# Patient Record
Sex: Female | Born: 1990 | Race: White | Hispanic: No | Marital: Single | Smoking: Never smoker
Health system: Southern US, Community
[De-identification: ages and names within clinical notes are randomized; demographics above are authoritative.]

## PROBLEM LIST (undated history)

## (undated) DIAGNOSIS — F909 Attention-deficit hyperactivity disorder, unspecified type: Secondary | ICD-10-CM

## (undated) DIAGNOSIS — S83519A Sprain of anterior cruciate ligament of unspecified knee, initial encounter: Secondary | ICD-10-CM

## (undated) DIAGNOSIS — Z87898 Personal history of other specified conditions: Secondary | ICD-10-CM

## (undated) HISTORY — DX: Attention-deficit hyperactivity disorder, unspecified type: F90.9

## (undated) HISTORY — DX: Personal history of other specified conditions: Z87.898

## (undated) HISTORY — PX: ANTERIOR CRUCIATE LIGAMENT REPAIR: SHX115

## (undated) HISTORY — PX: ORIF DISTAL RADIUS FRACTURE: SUR927

## (undated) HISTORY — DX: Sprain of anterior cruciate ligament of unspecified knee, initial encounter: S83.519A

---
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
## 2011-01-15 ENCOUNTER — Encounter: Payer: Self-pay | Admitting: Orthopedic Surgery

## 2011-01-23 NOTE — Letter (Signed)
Summary: Appointment - Reminder 2  [facility] [place] [address] [place] [address]   Phone: [phone]  Fax: [phone]     September 27, 2010 MRN: [id number]   [facility] [address] [place], [place]  [address]   Dear Ms. Verdell Carmine,  Our records indicate that it is time to schedule a follow-up appointment.  Dr.  Dietrich Pates           recommended that you follow up with [place] in        05/2010    . It is very important that we reach you to schedule this appointment. We look forward to participating in your health care needs. Please contact us at the number listed above at your earliest convenience to schedule your appointment.  If you are unable to make an appointment at this time, give [place] a call so we can update our records.     Sincerely,   Glass blower/designer

## 2011-04-01 LAB — BASIC METABOLIC PANEL
CO2: 30 mEq/L (ref 19–32)
Chloride: 110 mEq/L (ref 96–112)
Potassium: 3.5 mEq/L (ref 3.5–5.1)
Sodium: 142 mEq/L (ref 135–145)

## 2011-04-01 LAB — CBC
Hemoglobin: 14.3 g/dL (ref 12.0–16.0)
MCHC: 35.6 g/dL (ref 31.0–37.0)
MCV: 93.6 fL (ref 78.0–98.0)
RBC: 4.28 MIL/uL (ref 3.80–5.70)

## 2011-04-01 LAB — DIFFERENTIAL
Basophils Relative: 0 % (ref 0–1)
Eosinophils Absolute: 0.2 10*3/uL (ref 0.0–1.2)
Monocytes Absolute: 0.5 10*3/uL (ref 0.2–1.2)
Monocytes Relative: 6 % (ref 3–11)
Neutro Abs: 6.4 10*3/uL (ref 1.7–8.0)

## 2011-04-01 LAB — PREGNANCY, URINE: Preg Test, Ur: NEGATIVE

## 2011-04-01 LAB — URINALYSIS, ROUTINE W REFLEX MICROSCOPIC
Glucose, UA: NEGATIVE mg/dL
Hgb urine dipstick: NEGATIVE
Ketones, ur: NEGATIVE mg/dL
Protein, ur: NEGATIVE mg/dL

## 2011-04-01 LAB — URINE CULTURE: Colony Count: NO GROWTH

## 2011-05-08 NOTE — H&P (Signed)
NAMEBENELLI, Kristin Mathis              ACCOUNT NO.:  [id number]   MEDICAL RECORD NO.:  [id number]          PATIENT TYPE:  EMS   LOCATION:  ED                            FACILITY:  [facility]   PHYSICIAN:  Kingsley Callander. Ouida Sills, MD       DATE OF BIRTH:  May 18, 1991   DATE OF ADMISSION:  08/05/2009  DATE OF DISCHARGE:  08/13/2010LH                              HISTORY & PHYSICAL   CHIEF COMPLAINT:  Passed out.   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  She was evaluated initially by the rescue  squad.  The rescue squad remembers report that she was alert, but  anxious initially and began hyperventilating and then passed out again.  At the time of my arrival in the emergency room, she was alert and  appeared in her normal state.  She had a similar episode last month and  was evaluated in the emergency room.  She has a history of recurrent  syncopal episodes.  I had actually witnessed one last spring at a soccer  game in [place].  She had experienced a syncopal episode while  playing soccer on a very night.  She was evaluated on the field at that  point by paramedics, but was not then transported to a medical facility.  She has previously had a normal EKG.  She has never had an  echocardiogram.  It sounds as though her medical care has been through a  local family practice group, this is my first involvement in her case  other than when she was checked on the field at the soccer game.  She  has never had any witnessed-seizure activity.  She did not experience  tongue biting or any urinary or fecal incontinence.  She has had 4  episodes of syncope over the past year.  She previously had another  episode 3 years ago while playing soccer.  She takes no medications.  She had been treated with a course of Keflex for UTI last month.  She  has otherwise been healthy except  for an old elbow injury.   PHYSICAL EXAMINATION:  VITAL SIGNS:  Normal.  HEART:  Normal rhythm on telemetry.  HEENT:  Her conjunctivae are mildly injected with appearance she had  been tearful.  NECK:  Supple with no thyromegaly or lymphadenopathy.  LUNGS:  Clear.  HEART:  Regular with no murmurs, gallops, or rubs.  ABDOMEN:  Nontender.  No hepatosplenomegaly.  EXTREMITIES:  No calf tenderness or swelling.  No edema.  Pulses intact.  NEUROLOGIC:  Fully intact.   Her EKG reveals normal sinus rhythm with a normal QT interval.   IMPRESSION:  Recurrent syncopal episodes.  She is now hemodynamically  stable and neurologically intact.  She will be evaluated further as an  outpatient with an echocardiogram and a Cardiology consultation.      Kingsley Callander. Ouida Sills, MD  Electronically Signed     ROF/MEDQ  D:  08/06/2009  T:  08/06/2009  Job:  [id number]

## 2011-05-08 NOTE — Op Note (Signed)
NAMEGUSTAVO, Kristin Mathis             ACCOUNT NO.:  [id number]   MEDICAL RECORD NO.:  [id number]          PATIENT TYPE:  AMB   LOCATION:  DSC                          FACILITY:  [facility]   PHYSICIAN:  Harvie Junior, M.D.   DATE OF BIRTH:  1991/01/02   DATE OF PROCEDURE:  10/08/2008  DATE OF DISCHARGE:                               OPERATIVE REPORT   PREOPERATIVE DIAGNOSIS:  Anterior cruciate ligament tear.   POSTOPERATIVE DIAGNOSES:  1. Anterior cruciate ligament tear.  2. Medial plica.   OPERATIVE PROCEDURE:  1. Anterior cruciate ligament reconstruction  with essential one-third      patellar tendon allograft.  2. Debridement of medial plica.   SURGEON:  Harvie Junior, M.D.   ASSISTANT:  Marshia Ly, P.A.   ANESTHESIA:  General.   BRIEF HISTORY:  Ms. Bufford Spikes is a 20 year old female with a history of  having had a ACL and MCL tear.  We saw her in the office and evaluated,  ultimately got MRI which confirmed the diagnosis, allowed her a month to  allow the MCL to heal up and once her MCL felt good we took her to  operating room for ACL reconstruction.   PROCEDURE:  The patient was taken to the operating room.  After adequate  anesthesia obtained with general anesthetic the patient was placed  supine on the operating table.  The left leg was then examined under  anesthesia and noted to be pivot positive, Lachman positive.  She was  then prepped and draped in sterile fashion.  Following this routine  arthroscopic examination revealed there was a pristine patellofemoral  joint.  Attention was turned towards the medial compartment.  There was  a large medial plica block in the anterior set of the medial compartment  which was debrided to allow entrance into the medial compartment.  Medial compartment showed a normal medial meniscus.  We probed the  medial femoral condyle which was normal.  Attention was turned to the  notch where there was an absent ACL.  Attention was turned to  the  lateral side which was normal.  There was a large cyclops lesion  blocking the lateral compartment which was debrided as well as  anteriorly.  Once this was completely debrided, the attention was turned  to the central portion of the knee where a notchplasty was performed  after debridement of the old stump of the ACL.  Once this was completed,  attention was turned towards the tibial guide where the tibial guide was  set at 55 degrees, and this was placed on the old stump of the ACL 7 mm  anterior to the posterior cruciate ligament.  A small incision was made  in the proximal medial tibia.  The guide was then used and a guidewire  was advanced into the proximal tibia to stump the ACL 10 mm.  Reamer was  then used to overream this and a rasp was used in the back of the  tunnel.  A 6.5 mm over the top guide was then used and a Beath needle  was advanced after distal lateral femur.  This was reamed to a level of  25 that matched to the femoral bone plug.  At this point, the notch was  placed to the anterior portion of this tunnel.  At this point, all bony  shavings and remaining tissue was removed from the knee.  At this point,  the graft was advanced into the knee and excellent fixation was achieved  of the graft.  The femoral side with a 7 x 25 mm screw with a sheath and  on the tibial side with a 7 x 20 screw.  Excellent fixation of the graft  was achieved, perfect tensioning and extension and the knee came easily  under full extension and the graft was probed and had perfect tension.  Knee was now Lachman negative, pivot negative.  The knee was copiously  and thoroughly irrigated and suctioned dried.  The small incision on the  proximal medial tibia was closed with subcuticular stitches and the  portals were closed with a bandage.  Sterile compressive dressing was  applied as well as a TED stocking.  The patient was taken to the  recovery room and was noted to be in satisfactory  condition.  Estimated  blood loss for procedure was none.      Harvie Junior, M.D.  Electronically Signed     JLG/MEDQ  D:  10/08/2008  T:  10/08/2008  Job:  [id number]

## 2011-09-25 LAB — POCT HEMOGLOBIN-HEMACUE: Hemoglobin: 15

## 2011-12-19 ENCOUNTER — Encounter: Payer: Self-pay | Admitting: Cardiology

## 2012-01-21 ENCOUNTER — Encounter (HOSPITAL_COMMUNITY): Payer: Self-pay

## 2012-01-21 ENCOUNTER — Emergency Department (HOSPITAL_COMMUNITY)
Admission: EM | Admit: 2012-01-21 | Discharge: 2012-01-21 | Disposition: A | Discharge status: Home / Self Care | Payer: 59 | Attending: Emergency Medicine | Admitting: Emergency Medicine

## 2012-01-21 ENCOUNTER — Emergency Department (HOSPITAL_COMMUNITY): Payer: 59

## 2012-01-21 DIAGNOSIS — R0602 Shortness of breath: Secondary | ICD-10-CM | POA: Insufficient documentation

## 2012-01-21 DIAGNOSIS — N898 Other specified noninflammatory disorders of vagina: Secondary | ICD-10-CM | POA: Insufficient documentation

## 2012-01-21 DIAGNOSIS — R071 Chest pain on breathing: Secondary | ICD-10-CM | POA: Insufficient documentation

## 2012-01-21 DIAGNOSIS — R0789 Other chest pain: Secondary | ICD-10-CM

## 2012-01-21 LAB — URINALYSIS, ROUTINE W REFLEX MICROSCOPIC
Ketones, ur: NEGATIVE mg/dL
Leukocytes, UA: NEGATIVE
Nitrite: NEGATIVE
Urobilinogen, UA: 0.2 mg/dL (ref 0.0–1.0)
pH: 5.5 (ref 5.0–8.0)

## 2012-01-21 NOTE — ED Notes (Signed)
Pt reports reached for something with left arm and muscle in left side "caught."  Reports hurts to take a deep breath.  Denies injury, says has happened several times this past month.

## 2012-01-21 NOTE — ED Provider Notes (Signed)
History     CSN: [id number]  Arrival date & time 01/21/12  1135   First MD Initiated Contact with Patient 01/21/12 1329      Chief Complaint  Patient presents with  . Back Pain    (Consider location/radiation/quality/duration/timing/severity/associated sxs/prior treatment) HPI Comments: Pt has had intermittent pain in her L lower lateral rib area for ~ 1 year.  It has become acutely worse in the past couple days.  No recent or remote trauma.  No fever or cough.    "sharp" pain with deep inspiration, movement and palpation.  No OCP meds.  No prolonged convalescence.  No Ca history.  No prior DVT's in pt or extended family.  No hemoptysis.  The history is provided by the patient and a parent. No language interpreter was used.    Past Medical History  Diagnosis Date  . History of syncope   . Anterior cruciate ligament tear   . ADHD (attention deficit hyperactivity disorder)     Past Surgical History  Procedure Date  . Orif distal radius fracture   . Anterior cruciate ligament repair     No family history on file.  History  Substance Use Topics  . Smoking status: Never Smoker   . Smokeless tobacco: Not on file  . Alcohol Use: No    OB History    Grav Para Term Preterm Abortions TAB SAB Ect Mult Living                  Review of Systems  Respiratory: Positive for shortness of breath. Negative for cough, wheezing and stridor.   Cardiovascular: Negative for chest pain, palpitations and leg swelling.  Genitourinary: Positive for vaginal bleeding. Negative for dysuria, frequency, hematuria, flank pain, vaginal discharge and vaginal pain.  All other systems reviewed and are negative.    Allergies  Review of patient's allergies indicates no known allergies.  Home Medications  No current outpatient prescriptions on file.  BP 129/75  Pulse 99  Temp(Src) 98.5 F (36.9 C) (Oral)  Resp 18  Ht 5\' 4"  (1.626 m)  Wt 135 lb (61.236 kg)  BMI 23.17 kg/m2  SpO2 99%  LMP  01/16/2012  Physical Exam  Nursing note and vitals reviewed. Constitutional: She is oriented to person, place, and time. She appears well-developed and well-nourished. No distress.  HENT:  Head: Normocephalic and atraumatic.  Eyes: EOM are normal.  Neck: Normal range of motion.  Cardiovascular: Normal rate, regular rhythm and normal heart sounds.   Pulmonary/Chest: Effort normal and breath sounds normal. She has no decreased breath sounds. She has no wheezes. She has no rhonchi. She has no rales.    Abdominal: Soft. She exhibits no distension. There is no tenderness.  Musculoskeletal: Normal range of motion.  Neurological: She is alert and oriented to person, place, and time.  Skin: Skin is warm and dry.  Psychiatric: She has a normal mood and affect. Judgment normal.    ED Course  Procedures (including critical care time)   Labs Reviewed  URINALYSIS, ROUTINE W REFLEX MICROSCOPIC  PREGNANCY, URINE  D-DIMER, QUANTITATIVE   No results found.   No diagnosis found.    MDM          Worthy Rancher, PA 01/22/12 [phone]

## 2012-01-21 NOTE — ED Provider Notes (Signed)
21 year old female has been having episodes of sharp, left-sided chest pain for the last year. Pain will last up to 10-15 minutes before resolving. During that time, she can't take a deep breath because it makes the pain worse and she feels like she can't catch her breath. These episodes will come as often as every 2-3 days. She has not taken anything to try and help. In between episodes, she has no difficulties whatsoever. Workup is negative including negative chest x-ray. I doubt that this represents any serious pathology. She will be given a trial of NSAIDs to see if she gets relief.  Dione Booze, MD 01/21/12 [phone]

## 2012-01-21 NOTE — ED Notes (Signed)
Pain lt lat chest , onset this am when reached for something. Has not taken anything for pain. Pain has decreased.

## 2012-01-21 NOTE — ED Notes (Signed)
Pain lt post thigh for 3-4 mos.  Seen here for same.  Awaiting appt with Dr Romeo Apple.  Pain is from post thigh to lower leg.  No known injury. Has chronic back pain.

## 2012-01-22 NOTE — ED Provider Notes (Signed)
Medical screening examination/treatment/procedure(s) were performed by non-physician practitioner and as supervising physician I was immediately available for consultation/collaboration.   Dione Booze, MD 01/22/12 1730

## 2012-11-23 ENCOUNTER — Encounter (HOSPITAL_COMMUNITY): Payer: Self-pay | Admitting: Emergency Medicine

## 2012-11-23 ENCOUNTER — Emergency Department (HOSPITAL_COMMUNITY): Payer: 59

## 2012-11-23 ENCOUNTER — Emergency Department (HOSPITAL_COMMUNITY)
Admission: EM | Admit: 2012-11-23 | Discharge: 2012-11-23 | Disposition: A | Discharge status: Home / Self Care | Payer: 59 | Attending: Emergency Medicine | Admitting: Emergency Medicine

## 2012-11-23 DIAGNOSIS — R509 Fever, unspecified: Secondary | ICD-10-CM | POA: Insufficient documentation

## 2012-11-23 DIAGNOSIS — Z8781 Personal history of (healed) traumatic fracture: Secondary | ICD-10-CM | POA: Insufficient documentation

## 2012-11-23 DIAGNOSIS — R1110 Vomiting, unspecified: Secondary | ICD-10-CM | POA: Insufficient documentation

## 2012-11-23 DIAGNOSIS — R05 Cough: Secondary | ICD-10-CM | POA: Insufficient documentation

## 2012-11-23 DIAGNOSIS — F909 Attention-deficit hyperactivity disorder, unspecified type: Secondary | ICD-10-CM | POA: Insufficient documentation

## 2012-11-23 DIAGNOSIS — J988 Other specified respiratory disorders: Secondary | ICD-10-CM

## 2012-11-23 DIAGNOSIS — J029 Acute pharyngitis, unspecified: Secondary | ICD-10-CM

## 2012-11-23 DIAGNOSIS — Z79899 Other long term (current) drug therapy: Secondary | ICD-10-CM | POA: Insufficient documentation

## 2012-11-23 DIAGNOSIS — J3489 Other specified disorders of nose and nasal sinuses: Secondary | ICD-10-CM | POA: Insufficient documentation

## 2012-11-23 DIAGNOSIS — R51 Headache: Secondary | ICD-10-CM | POA: Insufficient documentation

## 2012-11-23 DIAGNOSIS — J069 Acute upper respiratory infection, unspecified: Secondary | ICD-10-CM | POA: Insufficient documentation

## 2012-11-23 DIAGNOSIS — R059 Cough, unspecified: Secondary | ICD-10-CM

## 2012-11-23 DIAGNOSIS — R52 Pain, unspecified: Secondary | ICD-10-CM | POA: Insufficient documentation

## 2012-11-23 LAB — RAPID STREP SCREEN (MED CTR MEBANE ONLY): Streptococcus, Group A Screen (Direct): NEGATIVE

## 2012-11-23 MED ORDER — SODIUM CHLORIDE 0.9 % IV BOLUS (SEPSIS)
1000.0000 mL | Freq: Once | INTRAVENOUS | Status: AC
  Administered 2012-11-23: 1000 mL via INTRAVENOUS

## 2012-11-23 MED ORDER — DEXAMETHASONE 10 MG/ML FOR PEDIATRIC ORAL USE: 10.0000 mg | Freq: Once | INTRAMUSCULAR | Status: DC

## 2012-11-23 MED ORDER — DEXAMETHASONE SODIUM PHOSPHATE 4 MG/ML IJ SOLN
10.0000 mg | Freq: Once | INTRAMUSCULAR | Status: AC
  Administered 2012-11-23: 10 mg via INTRAVENOUS
  Filled 2012-11-23: qty 3

## 2012-11-23 MED ORDER — ALBUTEROL SULFATE HFA 108 (90 BASE) MCG/ACT IN AERS: 2.0000 | INHALATION_SPRAY | RESPIRATORY_TRACT | Status: AC | PRN

## 2012-11-23 MED ORDER — ALBUTEROL SULFATE (5 MG/ML) 0.5% IN NEBU
2.5000 mg | INHALATION_SOLUTION | Freq: Once | RESPIRATORY_TRACT | Status: AC
  Administered 2012-11-23: 2.5 mg via RESPIRATORY_TRACT
  Filled 2012-11-23: qty 0.5

## 2012-11-23 NOTE — ED Provider Notes (Signed)
History     CSN: [id number]  Arrival date & time 11/23/12  1036   First MD Initiated Contact with Patient 11/23/12 1158      Chief Complaint  Patient presents with  . Vomiting  . Headache  . Sore Throat  . Generalized Body Aches  . Fever    (Consider location/radiation/quality/duration/timing/severity/associated sxs/prior treatment) Patient is a 21 y.o. female presenting with headaches and pharyngitis. The history is provided by the patient.  Headache   Sore Throat  She had onset yesterday which he describes as flulike symptoms. She did not have any objective fever, but has had chills and sweats and bodyaches. There's been a sore throat and nasal congestion. There's been a cough productive of some clear mucus. She has had post tussive emesis but no nausea. She has had generalized myalgias. There's been no constipation or diarrhea. She denies any sick contacts. In spite of post tussive emesis, she has been able to hold significant amounts of fluids down. She tried taking NyQuil which gave her temporary relief. Has not had her flu shot this year. Symptoms are described as severe. Swallowing makes her throat worse. NyQuil does give temporary relief.  Past Medical History  Diagnosis Date  . History of syncope   . Anterior cruciate ligament tear   . ADHD (attention deficit hyperactivity disorder)     Past Surgical History  Procedure Date  . Orif distal radius fracture   . Anterior cruciate ligament repair     History reviewed. No pertinent family history.  History  Substance Use Topics  . Smoking status: Never Smoker   . Smokeless tobacco: Not on file  . Alcohol Use: No    OB History    Grav Para Term Preterm Abortions TAB SAB Ect Mult Living                  Review of Systems  All other systems reviewed and are negative.    Allergies  Review of patient's allergies indicates no known allergies.  Home Medications   Current Outpatient Rx  Name  Route  Sig   Dispense  Refill  . IBUPROFEN 200 MG PO TABS   Oral   Take 400 mg by mouth every 6 (six) hours as needed. For pain         . NORETHIN ACE-ETH ESTRAD-FE 1-20 MG-MCG PO TABS   Oral   Take 1 tablet by mouth daily.         . NYQUIL PO   Oral   Take 30 mLs by mouth daily as needed. For cough/cold           BP 128/79  Pulse 106  Temp 98.3 F (36.8 C) (Oral)  Resp 18  Ht 5\' 4"  (1.626 m)  Wt 132 lb (59.875 kg)  BMI 22.66 kg/m2  SpO2 98%  LMP 11/17/2012  Physical Exam  Nursing note and vitals reviewed. 21 year old female, resting comfortably and in no acute distress. Vital signs are significant for tachycardia with heart rate 106. Oxygen saturation is 98%, which is normal. Head is normocephalic and atraumatic. PERRLA, EOMI. Oropharynx shows mild erythema and mild swelling. She is tolerating her secretions well. Phonation is normal. Neck is nontender and supple without adenopathy or JVD. Back is nontender and there is no CVA tenderness. Lungs are clear without rales, wheezes, or rhonchi. Chest is nontender. Heart has regular rate and rhythm without murmur. Abdomen is soft, flat, nontender without masses or hepatosplenomegaly and peristalsis is normoactive.  Extremities have no cyanosis or edema, full range of motion is present. Skin is warm and dry without rash. Neurologic: Mental status is normal, cranial nerves are intact, there are no motor or sensory deficits.   ED Course  Procedures (including critical care time)  Results for orders placed during the hospital encounter of 11/23/12  RAPID STREP SCREEN      Component Value Range   Streptococcus, Group A Screen (Direct) NEGATIVE  NEGATIVE   Dg Chest 2 View  11/23/2012  *RADIOLOGY REPORT*  Clinical Data: Cough and fever.  Vomiting and headache  CHEST - 2 VIEW  Comparison:  01/21/2012  Findings:  The heart size and mediastinal contours are within normal limits.  Both lungs are clear.  The visualized skeletal structures  are unremarkable.  IMPRESSION: No active cardiopulmonary disease.   Original Report Authenticated By: Janeece Riggers, M.D.    Images viewed by me.  1. Pharyngitis   2. Respiratory tract infection   3. Cough       MDM  Acute respiratory illness with predominant pharyngitis and bronchitis. A chest x-ray will be obtained to rule out pneumonia and strep screen will be obtained to rule out streptococcal pharyngitis. There is no influenza in the community currently, so she will not be treated with antivirals for influenza. Because of tachycardia, she will be given IV hydration and he'll be given a dose of dexamethasone for her pharyngitis. She'll be given a therapeutic trial of albuterol to see if it helps her cough.  She feels much better after the above noted medication. She is sent home with a prescription for albuterol inhaler and is given instruction sheets for sore throat, cough, upper respiratory infection.    Dione Booze, MD 11/23/12 1357

## 2012-11-23 NOTE — ED Notes (Signed)
Pt c/o sore throat, cough, vomiting, and fever since yesterday.

## 2014-11-07 IMAGING — CR DG CHEST 2V
2 series · 2 of 2 positions shown · non-contrast
Comparison: 01/21/2012

CLINICAL DATA: Cough and fever.  Vomiting and headache

CHEST - 2 VIEW

[view not recorded (1 of 2)]
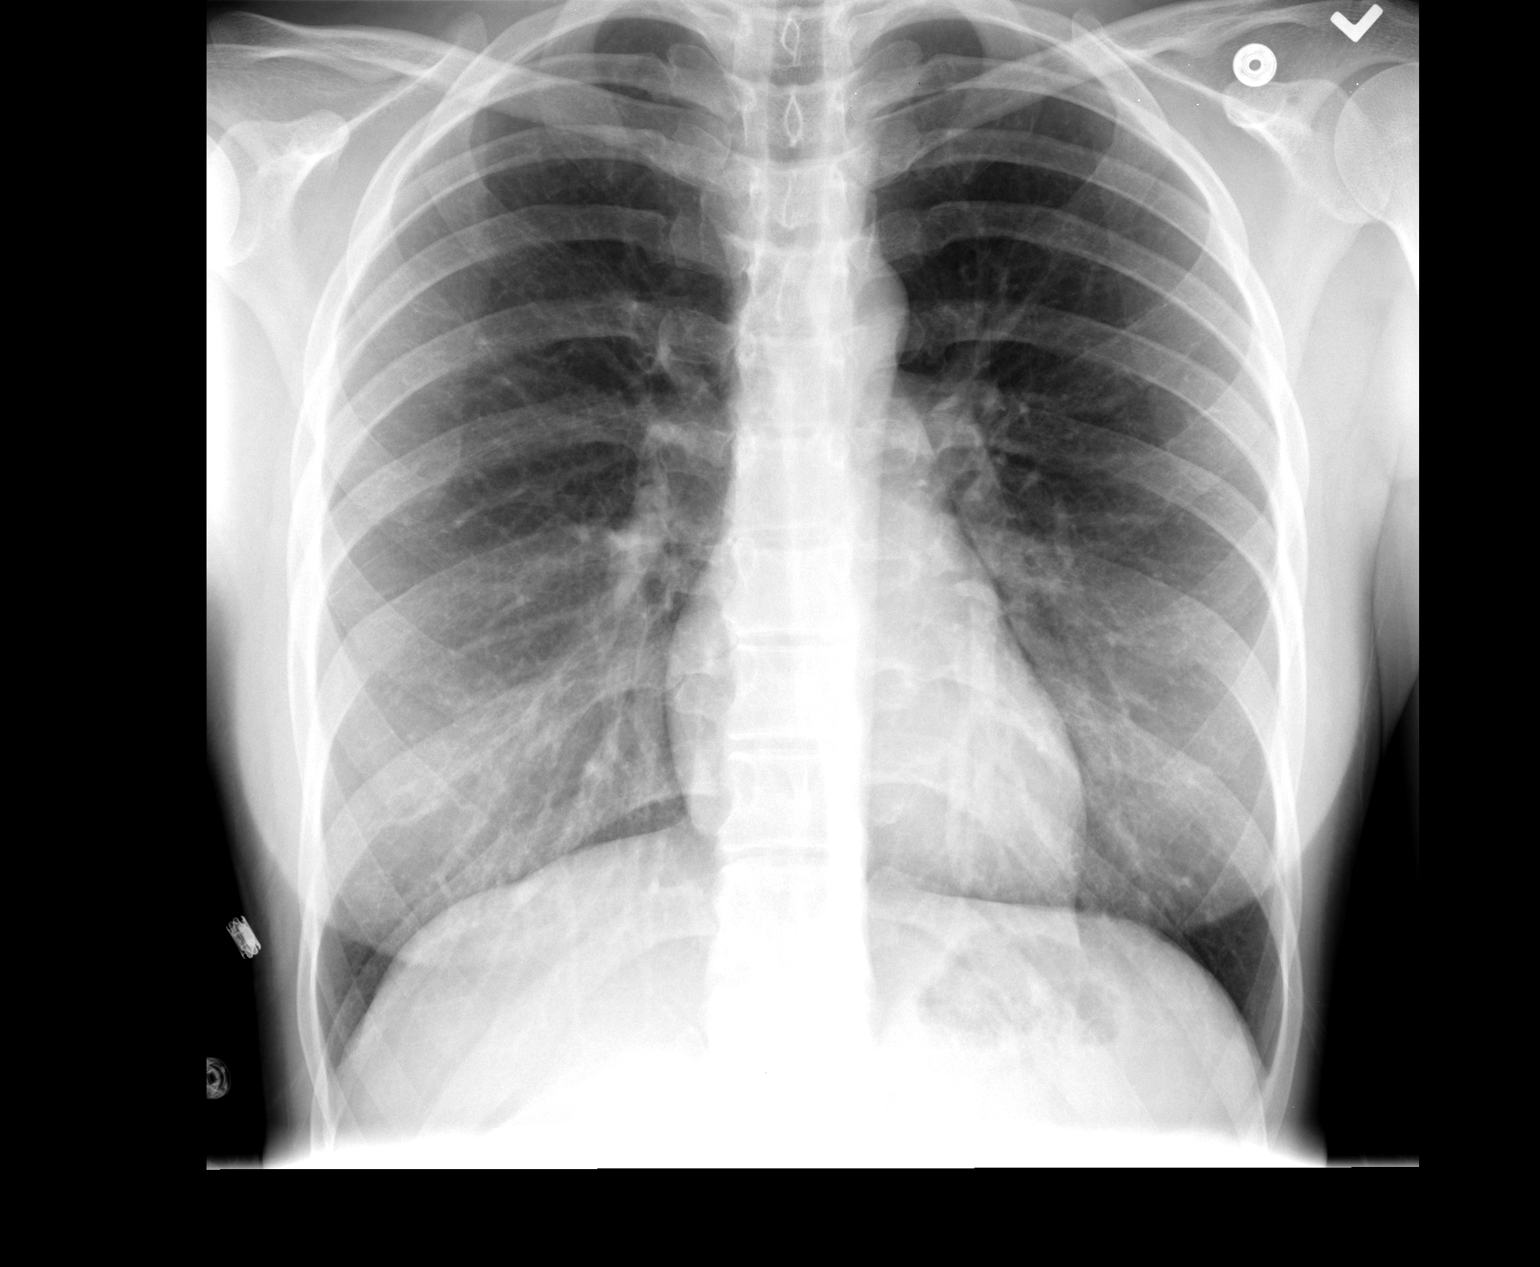

[view not recorded (2 of 2)]
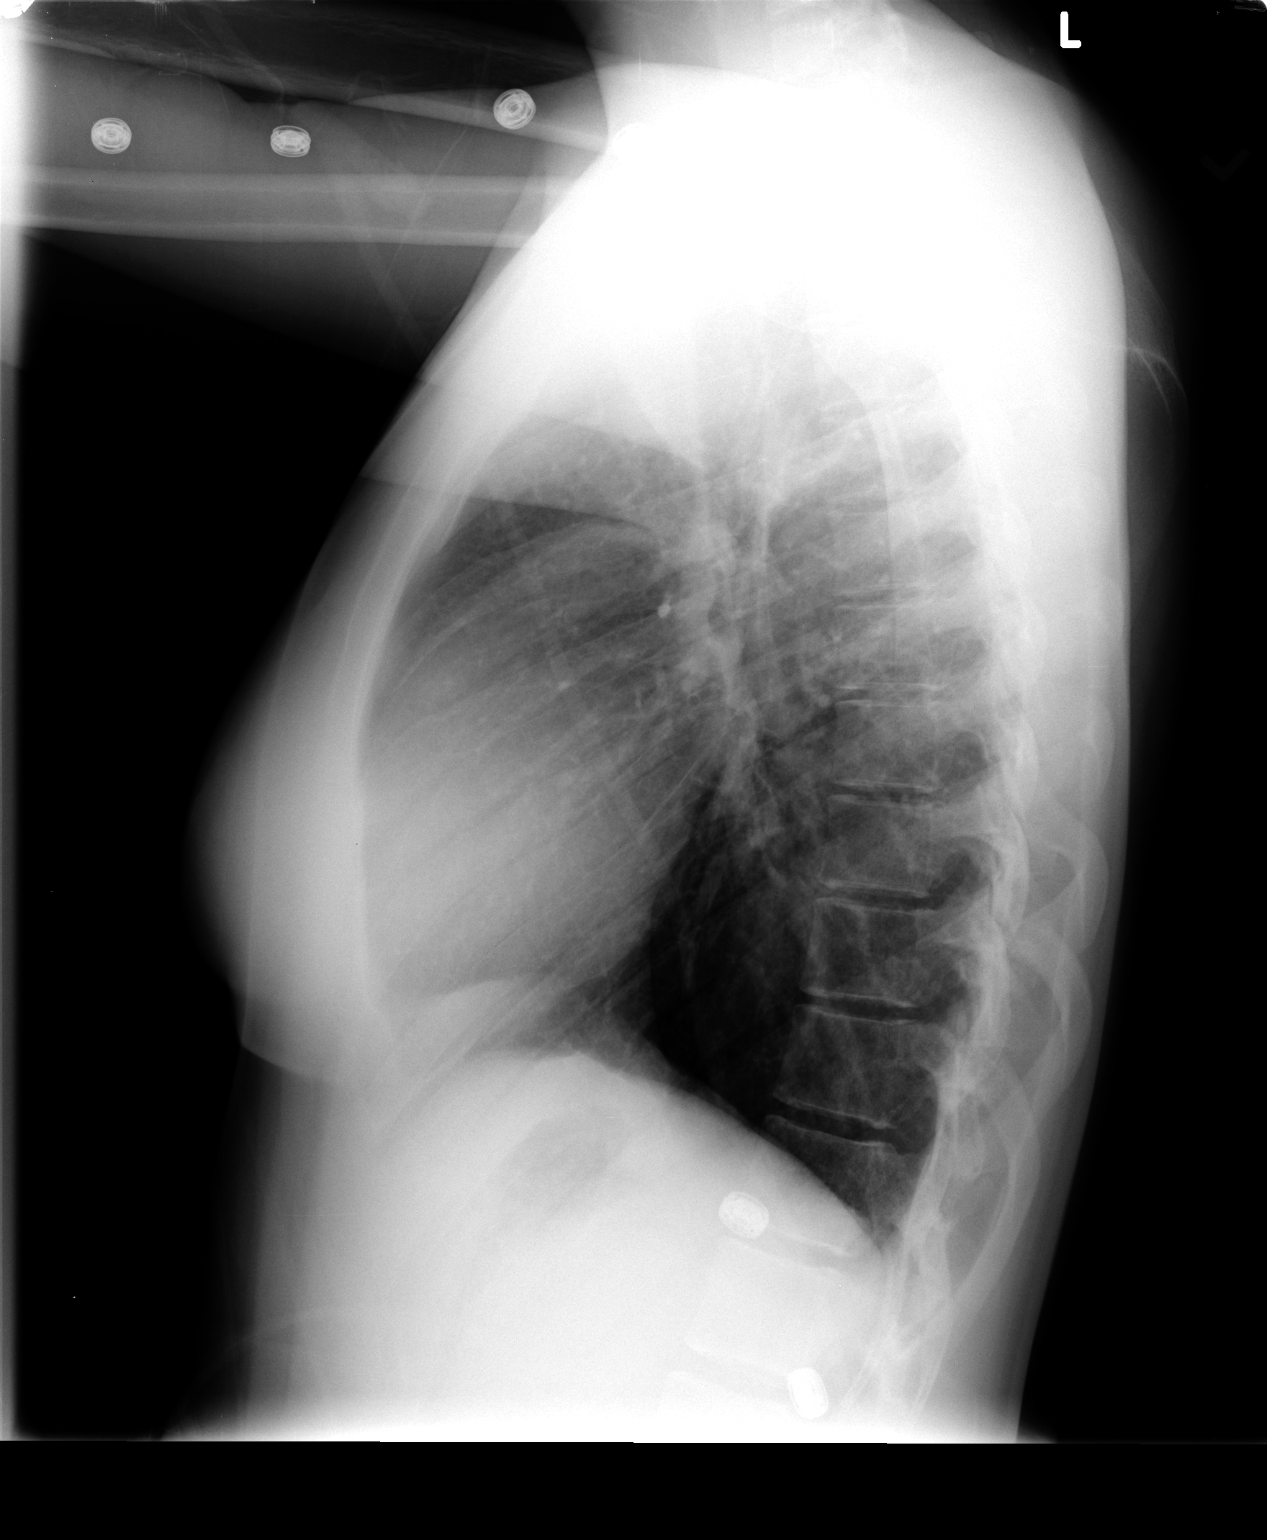

[2 of 2 positions shown; findings below may reference images not displayed]

FINDINGS: The heart size and mediastinal contours are within
normal limits.  Both lungs are clear.  The visualized skeletal
structures are unremarkable.
IMPRESSION: No active cardiopulmonary disease.

## 2015-01-30 ENCOUNTER — Emergency Department (HOSPITAL_COMMUNITY)
Admission: EM | Admit: 2015-01-30 | Discharge: 2015-01-30 | Disposition: A | Discharge status: Home / Self Care | Payer: 59 | Attending: Emergency Medicine | Admitting: Emergency Medicine

## 2015-01-30 DIAGNOSIS — R112 Nausea with vomiting, unspecified: Secondary | ICD-10-CM | POA: Insufficient documentation

## 2015-01-30 DIAGNOSIS — F10120 Alcohol abuse with intoxication, uncomplicated: Secondary | ICD-10-CM | POA: Insufficient documentation

## 2015-01-30 DIAGNOSIS — F10920 Alcohol use, unspecified with intoxication, uncomplicated: Secondary | ICD-10-CM

## 2015-01-30 DIAGNOSIS — F10129 Alcohol abuse with intoxication, unspecified: Secondary | ICD-10-CM | POA: Diagnosis present

## 2015-01-30 MED ORDER — SODIUM CHLORIDE 0.9 % IV BOLUS (SEPSIS)
1000.0000 mL | Freq: Once | INTRAVENOUS | Status: AC
  Administered 2015-01-30: 1000 mL via INTRAVENOUS

## 2015-01-30 MED ORDER — ONDANSETRON HCL 4 MG/2ML IJ SOLN
4.0000 mg | Freq: Once | INTRAMUSCULAR | Status: AC
  Administered 2015-01-30: 4 mg via INTRAVENOUS
  Filled 2015-01-30: qty 2

## 2015-01-30 NOTE — ED Notes (Signed)
Pt awake. Verbally responsive. Resp even and unlabored. No audible adventitious breath sounds noted. ABC's. Pt ambulated to BR with steady gait. No reported n/v noted. Pt called mother to make aware of where she is.

## 2015-01-30 NOTE — ED Notes (Signed)
Bed: WA09 Expected date:  Expected time:  Means of arrival:  Comments: EMS 70F etoh/vomiting in cab

## 2015-01-30 NOTE — Discharge Instructions (Signed)
Alcohol Intoxication °Alcohol intoxication occurs when the amount of alcohol that a person has consumed impairs his or her ability to mentally and physically function. Alcohol directly impairs the normal chemical activity of the brain. Drinking large amounts of alcohol can lead to changes in mental function and behavior, and it can cause many physical effects that can be harmful.  °Alcohol intoxication can range in severity from mild to very severe. Various factors can affect the level of intoxication that occurs, such as the person's age, gender, weight, frequency of alcohol consumption, and the presence of other medical conditions (such as diabetes, seizures, or heart conditions). Dangerous levels of alcohol intoxication may occur when people drink large amounts of alcohol in a short period (binge drinking). Alcohol can also be especially dangerous when combined with certain prescription medicines or "recreational" drugs. °SIGNS AND SYMPTOMS °Some common signs and symptoms of mild alcohol intoxication include: °· Loss of coordination. °· Changes in mood and behavior. °· Impaired judgment. °· Slurred speech. °As alcohol intoxication progresses to more severe levels, other signs and symptoms will appear. These may include: °· Vomiting. °· Confusion and impaired memory. °· Slowed breathing. °· Seizures. °· Loss of consciousness. °DIAGNOSIS  °Your health care provider will take a medical history and perform a physical exam. You will be asked about the amount and type of alcohol you have consumed. Blood tests will be done to measure the concentration of alcohol in your blood. In many places, your blood alcohol level must be lower than 80 mg/dL (0.08%) to legally drive. However, many dangerous effects of alcohol can occur at much lower levels.  °TREATMENT  °People with alcohol intoxication often do not require treatment. Most of the effects of alcohol intoxication are temporary, and they go away as the alcohol naturally  leaves the body. Your health care provider will monitor your condition until you are stable enough to go home. Fluids are sometimes given through an IV access tube to help prevent dehydration.  °HOME CARE INSTRUCTIONS °· Do not drive after drinking alcohol. °· Stay hydrated. Drink enough water and fluids to keep your urine clear or pale yellow. Avoid caffeine.   °· Only take over-the-counter or prescription medicines as directed by your health care provider.   °SEEK MEDICAL CARE IF:  °· You have persistent vomiting.   °· You do not feel better after a few days. °· You have frequent alcohol intoxication. Your health care provider can help determine if you should see a substance use treatment counselor. °SEEK IMMEDIATE MEDICAL CARE IF:  °· You become shaky or tremble when you try to stop drinking.   °· You shake uncontrollably (seizure).   °· You throw up (vomit) blood. This may be bright red or may look like black coffee grounds.   °· You have blood in your stool. This may be bright red or may appear as a black, tarry, bad smelling stool.   °· You become lightheaded or faint.   °MAKE SURE YOU:  °· Understand these instructions. °· Will watch your condition. °· Will get help right away if you are not doing well or get worse. °Document Released: 09/19/2005 Document Revised: 08/12/2013 Document Reviewed: 05/15/2013 °ExitCare® Patient Information ©2015 [employer]. This information is not intended to replace advice given to you by your health care provider. Make sure you discuss any questions you have with your health care provider. ° °How Much is Too Much Alcohol? °Drinking too much alcohol can cause injury, accidents, and health problems. These types of problems can include:  °· Car   crashes.  Falls.  Family fighting (domestic violence).  Drowning.  Fights.  Injuries.  Burns.  Damage to certain organs.  Having a baby with birth defects. ONE DRINK CAN BE TOO MUCH WHEN YOU ARE:  Working.  Pregnant  or breastfeeding.  Taking medicines. Ask your doctor.  Driving or planning to drive. WHAT IS A STANDARD DRINK?   1 regular beer (12 ounces or 360 milliliters).  1 glass of wine (5 ounces or 150 milliliters).  1 shot of liquor (1.5 ounces or 45 milliliters). BLOOD ALCOHOL LEVELS   .00 A person is sober.  Marland Kitchen.03 A person has no trouble keeping balance, talking, or seeing right, but a "buzz" may be felt.  Marland Kitchen.05 A person feels "buzzed" and relaxed.  Marland Kitchen.08 or .10  A person is drunk. He or she has trouble talking, seeing right, and keeping his or her balance.  .15 A person loses body control and may pass out (blackout).  .20 A person has trouble walking (staggering) and throws up (vomits).  .30 A person will pass out (unconscious).  .40+ A person will be in a coma. Death is possible. If you or someone you know has a drinking problem, get help from a doctor.  Document Released: 10/06/2009 Document Revised: 03/03/2012 Document Reviewed: 10/06/2009 [facility] Patient Information 2015 [place], [place]. This information is not intended to replace advice given to you by your health care provider. Make sure you discuss any questions you have with your health care provider.

## 2015-01-30 NOTE — ED Notes (Signed)
Unable to obtain information for this assessment, pt. Unable to stay awake to answer questions.

## 2015-01-30 NOTE — ED Notes (Signed)
Awake. Verbally responsive. A/O x4. Resp even and unlabored. No audible adventitious breath sounds noted. ABC's intact. Family at bedside. 

## 2015-01-30 NOTE — ED Notes (Signed)
Per EMS, pt. Intoxicated, Was picked up from a cab who was reported of vomiting inside a cab on her way home. Pt. Was with her friends in a club earlier today and had drinks.  Received zofran 4mg  IV inj. On board EMS with NS 500ml.

## 2015-01-30 NOTE — ED Notes (Signed)
MD at bedside. 

## 2015-01-30 NOTE — ED Provider Notes (Signed)
CSN: [id number]     Arrival date & time 01/30/15  [id number] History   First MD Initiated Contact with Patient 01/30/15 [phone]     Chief Complaint  Patient presents with  . Alcohol Intoxication     (Consider location/radiation/quality/duration/timing/severity/associated sxs/prior Treatment) HPI   23yF with heavy etoh ingestion and vomiting. Reports drinking with friends. Apparently began vomiting in cab and brought to ED. Feels nauseated and cold. No other complaints. No significant pain anywhere. Denies ingestion aside from alcohol. Denies fall or assault. Denies thoughts of self harm.   No past medical history on file. No past surgical history on file. No family history on file. History  Substance Use Topics  . Smoking status: Not on file  . Smokeless tobacco: Not on file  . Alcohol Use: Not on file   OB History    No data available     Review of Systems  All systems reviewed and negative, other than as noted in HPI.   Allergies  Review of patient's allergies indicates not on file.  Home Medications   Prior to Admission medications   Not on File   BP 112/90 mmHg  Pulse 91  Temp(Src) 97.9 F (36.6 C) (Oral)  Resp 18  Ht 5\' 5"  (1.651 m)  Wt 150 lb (68.04 kg)  BMI 24.96 kg/m2  SpO2 97% Physical Exam  Constitutional: She appears well-developed and well-nourished. No distress.  HENT:  Head: Normocephalic and atraumatic.  Eyes: Pupils are equal, round, and reactive to light. Right eye exhibits no discharge. Left eye exhibits no discharge.  B/l conjunctival injection  Neck: Neck supple.  Cardiovascular: Normal rate, regular rhythm and normal heart sounds.  Exam reveals no gallop and no friction rub.   No murmur heard. Pulmonary/Chest: Effort normal and breath sounds normal. No respiratory distress.  Abdominal: Soft. She exhibits no distension. There is no tenderness.  Musculoskeletal: She exhibits no edema or tenderness.  No external signs of acute trauma. No bony  tenderness of extremities or apparent pain with rom of large joints. No midline spinal tenderness.  Neurological: She is alert.  Drowsy. Awakens to voice. Follows commands. Answers questions appropriately. Speech/movements slow consistent with etoh intoxication but understandable and no focal motor deficit.   Skin: Skin is warm and dry.  Psychiatric: She has a normal mood and affect. Her behavior is normal. Thought content normal.  Nursing note and vitals reviewed.   ED Course  Procedures (including critical care time) Labs Review Labs Reviewed - No data to display  Imaging Review No results found.   EKG Interpretation None      MDM   Final diagnoses:  Alcohol intoxication, uncomplicated  Non-intractable vomiting with nausea, vomiting of unspecified type    23yf with etoh intoxication w/o apparent complicating features. Will hold in ED until clinically sober or responsible party can pick her up. IVF. Antiemetics.     Raeford RazorStephen Glover Capano, MD 02/03/15 [phone]
